# Patient Record
Sex: Female | Born: 1995 | Race: White | Hispanic: No | Marital: Single | Smoking: Never smoker
Health system: Southern US, Community
[De-identification: ages and names within clinical notes are randomized; demographics above are authoritative.]

## PROBLEM LIST (undated history)

## (undated) DIAGNOSIS — K219 Gastro-esophageal reflux disease without esophagitis: Secondary | ICD-10-CM

## (undated) DIAGNOSIS — N3281 Overactive bladder: Secondary | ICD-10-CM

## (undated) DIAGNOSIS — R3911 Hesitancy of micturition: Secondary | ICD-10-CM

## (undated) DIAGNOSIS — K5900 Constipation, unspecified: Secondary | ICD-10-CM

## (undated) DIAGNOSIS — R109 Unspecified abdominal pain: Secondary | ICD-10-CM

## (undated) DIAGNOSIS — F411 Generalized anxiety disorder: Secondary | ICD-10-CM

## (undated) DIAGNOSIS — IMO0001 Reserved for inherently not codable concepts without codable children: Secondary | ICD-10-CM

## (undated) DIAGNOSIS — Q630 Accessory kidney: Secondary | ICD-10-CM

## (undated) DIAGNOSIS — Q625 Duplication of ureter: Secondary | ICD-10-CM

## (undated) DIAGNOSIS — Z87440 Personal history of urinary (tract) infections: Secondary | ICD-10-CM

## (undated) DIAGNOSIS — N200 Calculus of kidney: Secondary | ICD-10-CM

## (undated) HISTORY — DX: Generalized anxiety disorder: F41.1

## (undated) HISTORY — DX: Duplication of ureter: Q62.5

## (undated) HISTORY — DX: Hesitancy of micturition: R39.11

## (undated) HISTORY — DX: Accessory kidney: Q63.0

## (undated) HISTORY — DX: Overactive bladder: N32.81

## (undated) HISTORY — DX: Personal history of urinary (tract) infections: Z87.440

## (undated) HISTORY — DX: Constipation, unspecified: K59.00

## (undated) HISTORY — DX: Unspecified abdominal pain: R10.9

## (undated) HISTORY — DX: Calculus of kidney: N20.0

## (undated) HISTORY — DX: Gastro-esophageal reflux disease without esophagitis: K21.9

## (undated) HISTORY — DX: Reserved for inherently not codable concepts without codable children: IMO0001

---
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
## 2016-03-27 IMAGING — CR DG ABDOMEN 1V
1 series · 1 of 1 positions shown · non-contrast
Comparison: Report of [REDACTED] abdomen radiograph 05/30/2015
(no images available).

CLINICAL DATA: 19-year-old female with suprapubic pain. Initial
encounter. Patient reports personal history of nephrolithiasis and
duplicated renal collecting system.

EXAM:
ABDOMEN - 1 VIEW

[dg abd 1 view]
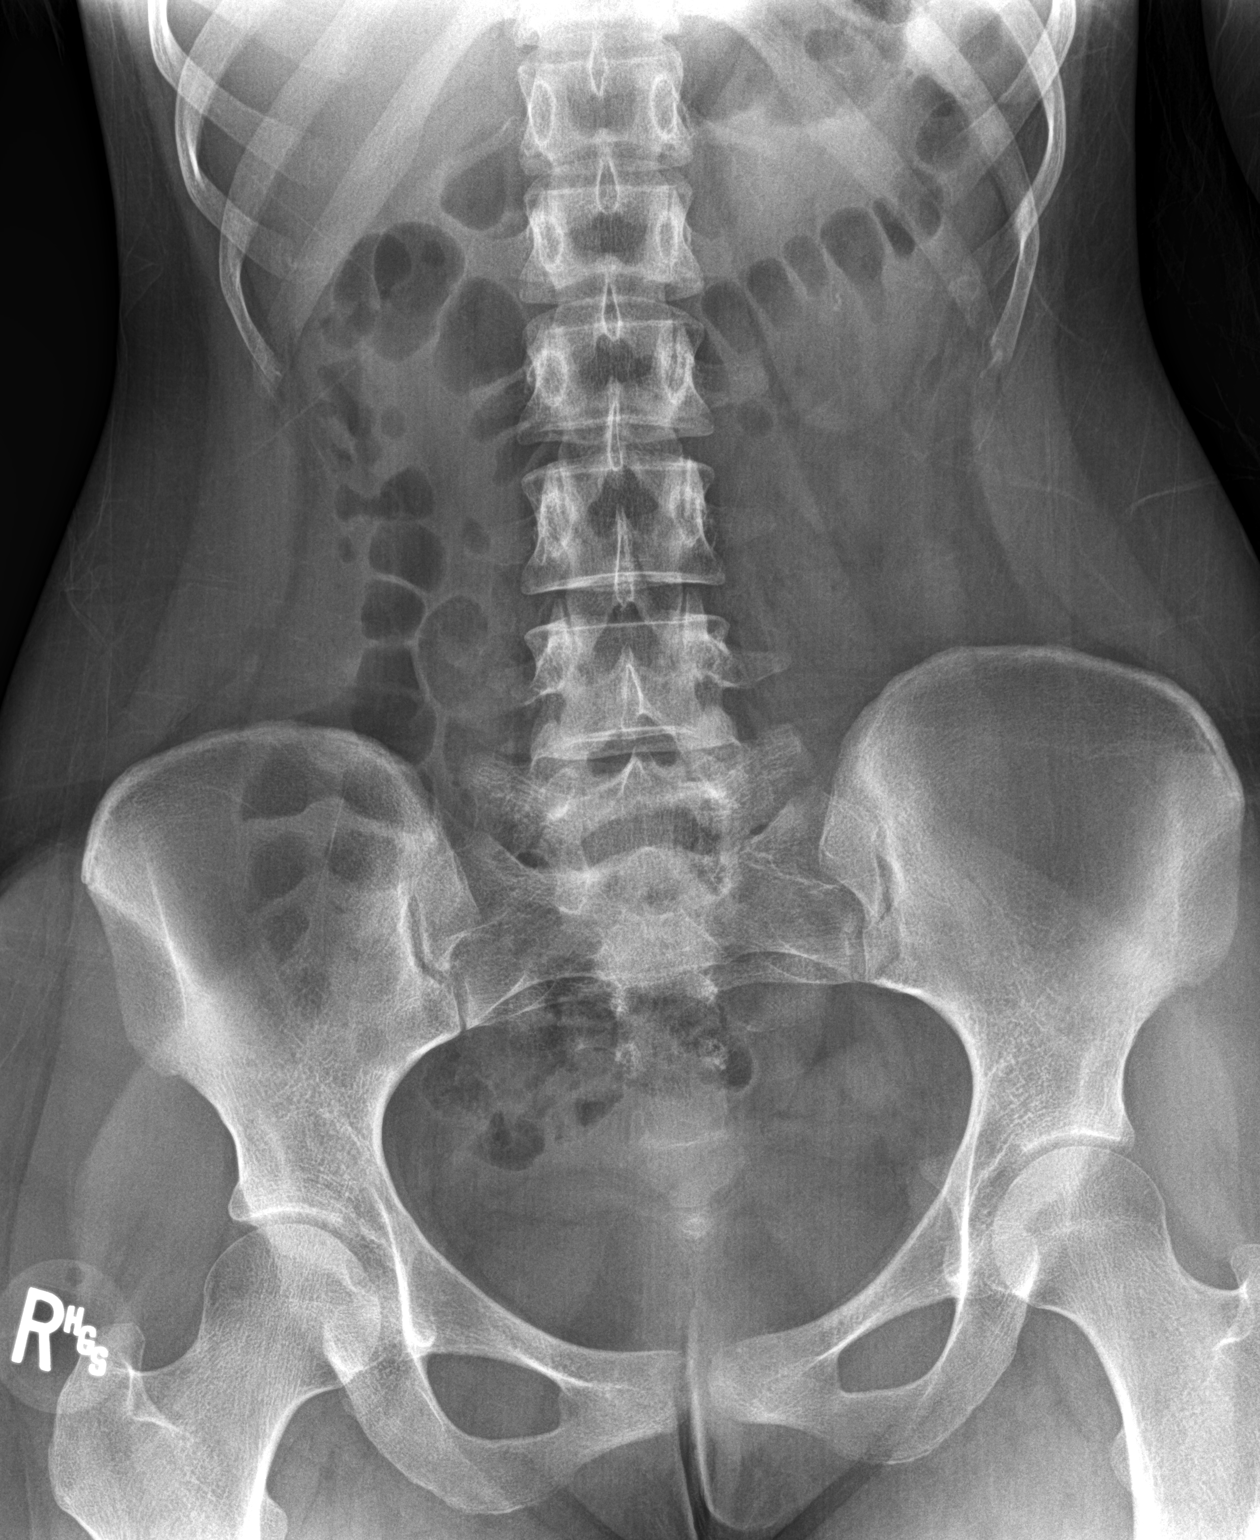

[1 of 1 positions shown; findings below may reference images not displayed]

FINDINGS: Non obstructed bowel gas pattern. Possible 3-4 mm calcific density
projecting at the left renal lower pole (arrow). No other urologic
calculus identified. Normal visualized abdominal and pelvic visceral
contours. No osseous abnormality identified. Hypoplastic ribs
suspected at T12.
IMPRESSION: Negative aside from possible 3-4 mm left lower pole nephrolithiasis.

## 2016-04-22 ENCOUNTER — Telehealth: Payer: Self-pay | Admitting: Urology

## 2016-04-22 NOTE — Telephone Encounter (Signed)
Called to move patient's appt to another time since we are in [place] now on Friday's and she informed me that she no longer lives here. [place] moved back to [place]. She asked me to cancel her appt.  Marcelino DusterMichelle

## 2016-06-25 ENCOUNTER — Ambulatory Visit: Payer: 59 | Admitting: Urology
